# Patient Record
Sex: Male | Born: 1965 | Race: White | Hispanic: No | Marital: Married | Smoking: Never smoker
Health system: Southern US, Community
[De-identification: ages and names within clinical notes are randomized; demographics above are authoritative.]

## PROBLEM LIST (undated history)

## (undated) DIAGNOSIS — K219 Gastro-esophageal reflux disease without esophagitis: Secondary | ICD-10-CM

## (undated) DIAGNOSIS — G4730 Sleep apnea, unspecified: Secondary | ICD-10-CM

## (undated) DIAGNOSIS — K759 Inflammatory liver disease, unspecified: Secondary | ICD-10-CM

## (undated) DIAGNOSIS — I10 Essential (primary) hypertension: Secondary | ICD-10-CM

## (undated) DIAGNOSIS — I2510 Atherosclerotic heart disease of native coronary artery without angina pectoris: Secondary | ICD-10-CM

## (undated) HISTORY — PX: CARDIAC CATHETERIZATION: SHX172

## (undated) HISTORY — PX: CHOLECYSTECTOMY: SHX55

## (undated) HISTORY — PX: COLONOSCOPY WITH ESOPHAGOGASTRODUODENOSCOPY (EGD): SHX5779

---

## 2004-08-05 ENCOUNTER — Ambulatory Visit: Payer: Self-pay | Admitting: Unknown Physician Specialty

## 2004-08-05 ENCOUNTER — Other Ambulatory Visit: Payer: Self-pay

## 2004-08-05 ENCOUNTER — Emergency Department: Payer: Self-pay | Admitting: Emergency Medicine

## 2004-08-09 ENCOUNTER — Ambulatory Visit: Payer: Self-pay | Admitting: Unknown Physician Specialty

## 2004-08-12 ENCOUNTER — Ambulatory Visit: Payer: Self-pay | Admitting: Surgery

## 2012-12-12 ENCOUNTER — Ambulatory Visit: Payer: Self-pay | Admitting: Physician Assistant

## 2012-12-23 ENCOUNTER — Ambulatory Visit: Payer: Self-pay | Admitting: Internal Medicine

## 2017-06-15 ENCOUNTER — Other Ambulatory Visit: Payer: Self-pay | Admitting: Physician Assistant

## 2017-06-15 DIAGNOSIS — R319 Hematuria, unspecified: Secondary | ICD-10-CM

## 2017-06-26 ENCOUNTER — Ambulatory Visit
Admission: RE | Admit: 2017-06-26 | Discharge: 2017-06-26 | Disposition: A | Discharge status: Home / Self Care | Payer: 59 | Source: Ambulatory Visit | Attending: Physician Assistant | Admitting: Physician Assistant

## 2017-06-26 DIAGNOSIS — M5136 Other intervertebral disc degeneration, lumbar region: Secondary | ICD-10-CM | POA: Insufficient documentation

## 2017-06-26 DIAGNOSIS — K760 Fatty (change of) liver, not elsewhere classified: Secondary | ICD-10-CM | POA: Diagnosis not present

## 2017-06-26 DIAGNOSIS — M47896 Other spondylosis, lumbar region: Secondary | ICD-10-CM | POA: Diagnosis not present

## 2017-06-26 DIAGNOSIS — R319 Hematuria, unspecified: Secondary | ICD-10-CM | POA: Diagnosis not present

## 2017-06-26 DIAGNOSIS — I700 Atherosclerosis of aorta: Secondary | ICD-10-CM | POA: Insufficient documentation

## 2017-06-26 HISTORY — DX: Essential (primary) hypertension: I10

## 2017-06-26 MED ORDER — IOPAMIDOL (ISOVUE-300) INJECTION 61%
125.0000 mL | Freq: Once | INTRAVENOUS | Status: AC | PRN
  Administered 2017-06-26: 125 mL via INTRAVENOUS

## 2018-07-12 IMAGING — CT CT ABD-PEL WO/W CM
3 of 6 series · 14 of 32 positions shown, 19 images · IV contrast (APPLIED)
Comparison: None.

CLINICAL DATA: Gross hematuria in urinary frequency for 2 months.

EXAM:
CT ABDOMEN AND PELVIS WITHOUT AND WITH CONTRAST
TECHNIQUE: Multidetector CT imaging of the abdomen and pelvis was performed
following the standard protocol before and following the bolus
administration of intravenous contrast.
CONTRAST:  125mL LEKKBW-4KK IOPAMIDOL (LEKKBW-4KK) INJECTION 61%

[Series 2: axial pre · axial · non-contrast · 0.91mm/px · z∈[-991,-901]mm · 2 of 111 slices shown]
[im 19/111  soft-tissue]
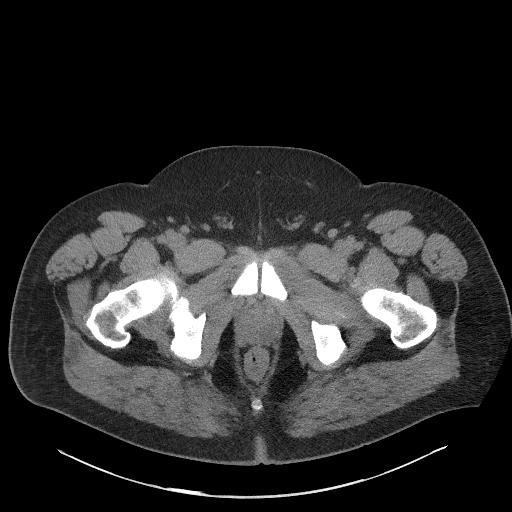
[im 37/111  soft-tissue]
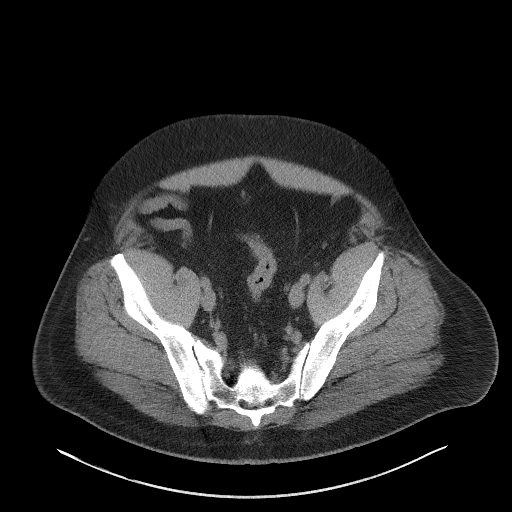

[Series 4: axial post · axial · 0.91mm/px · z∈[-1006,-611]mm · 6 of 111 slices shown]
[im 16/111  soft-tissue]
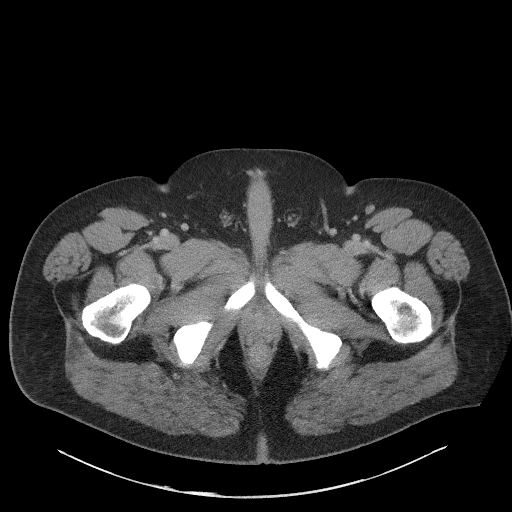
[im 32/111  soft-tissue]
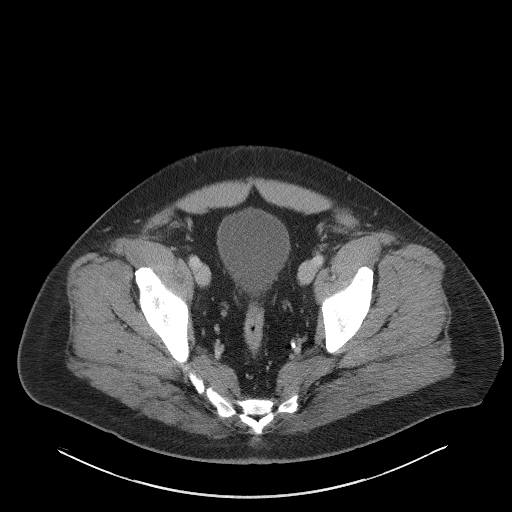
[im 48/111  soft-tissue]
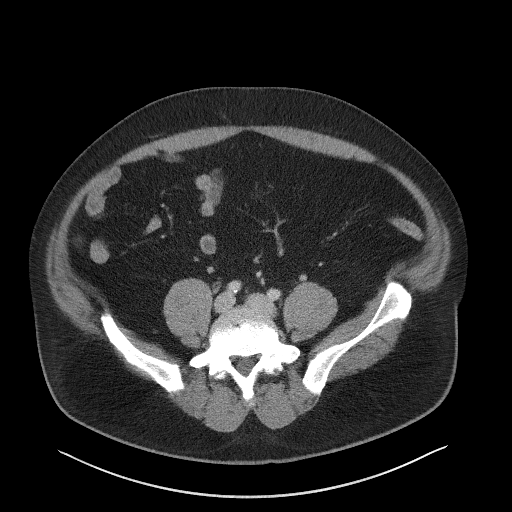
[im 63/111  soft-tissue]
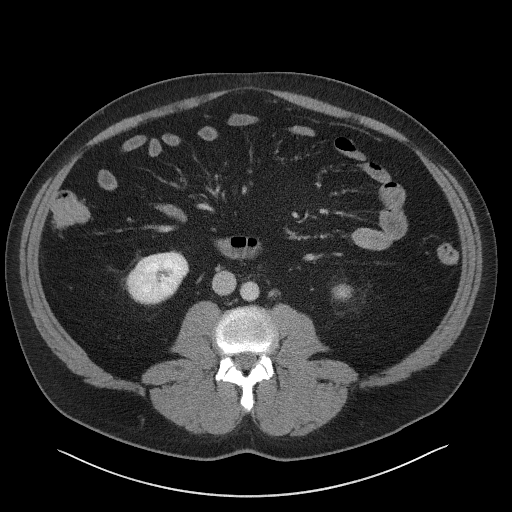
[im 79/111  soft-tissue]
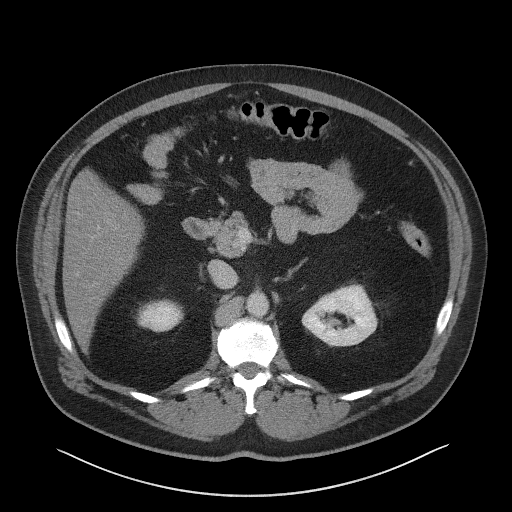
[im 95/111  soft-tissue]
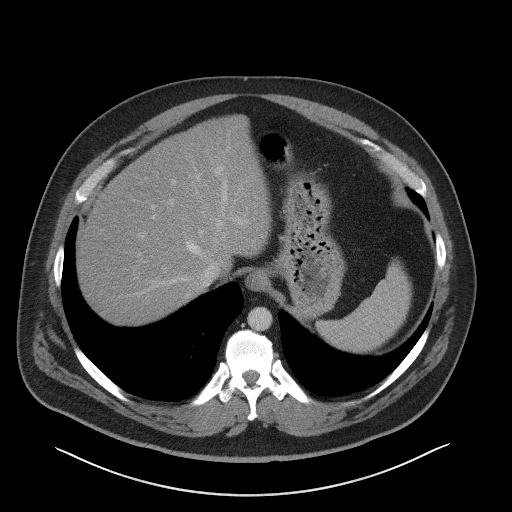

[Series 13: axial delay · axial · delayed · 0.87mm/px · z∈[-1009,-584]mm · 6 of 119 slices shown, 11 images]
[im 17/119  soft-tissue]
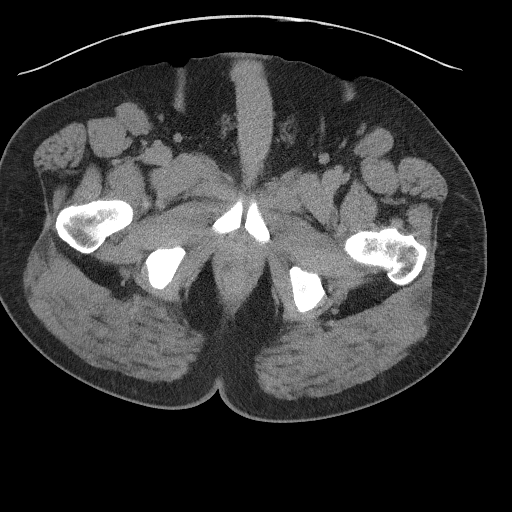
[im 17/119  bone]
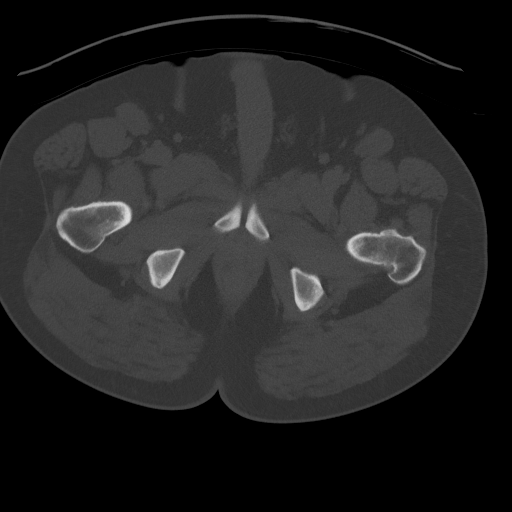
[im 34/119  soft-tissue]
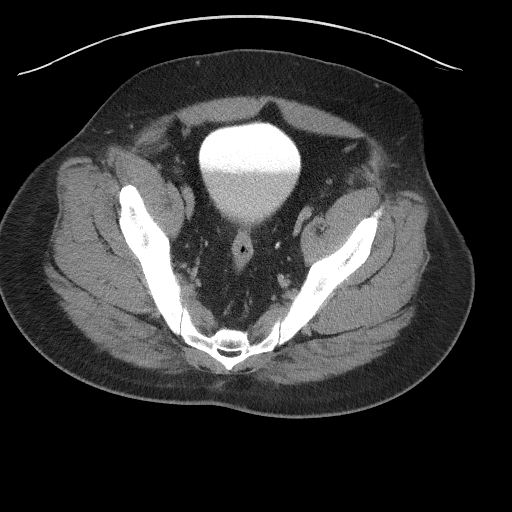
[im 51/119  soft-tissue]
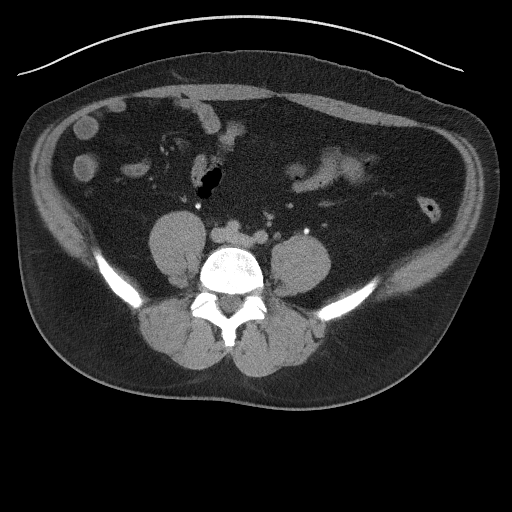
[im 51/119  lung]
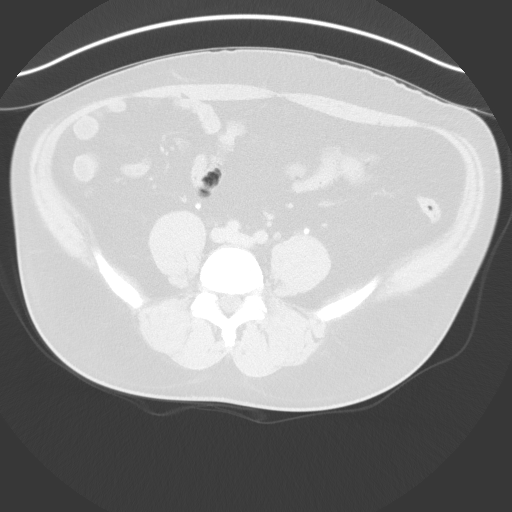
[im 68/119  soft-tissue]
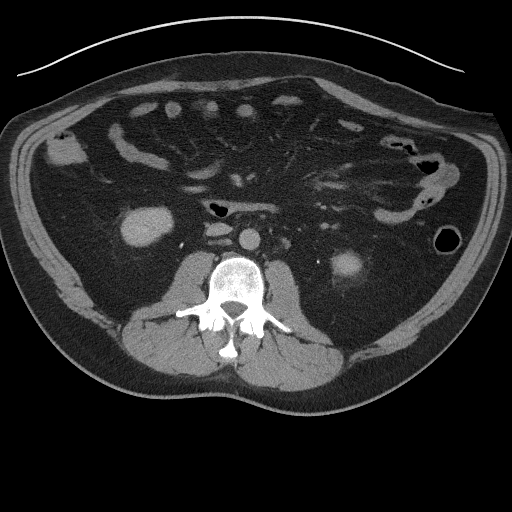
[im 68/119  lung]
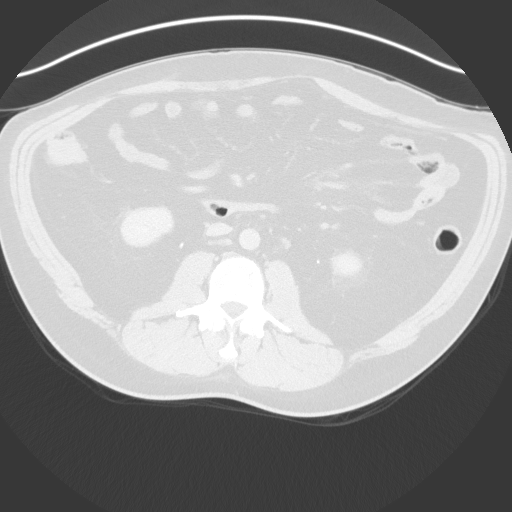
[im 85/119  soft-tissue]
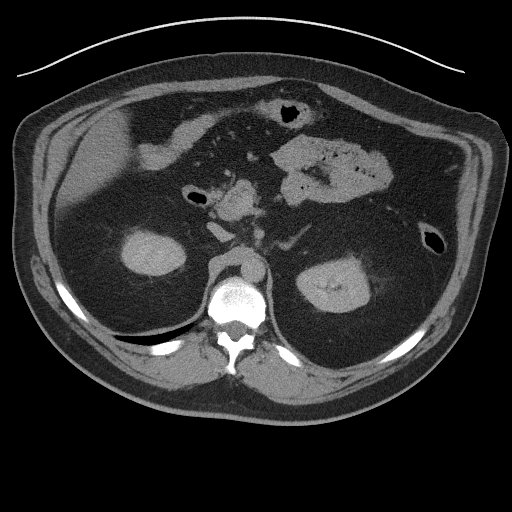
[im 85/119  lung]
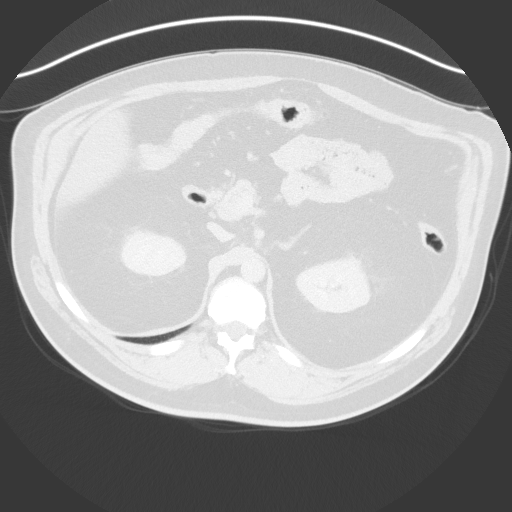
[im 102/119  soft-tissue]
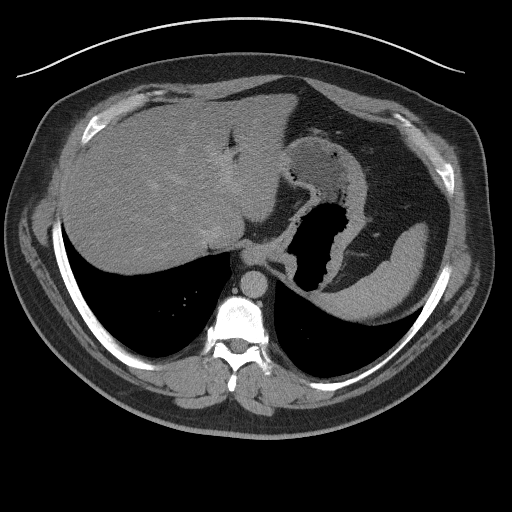
[im 102/119  lung]
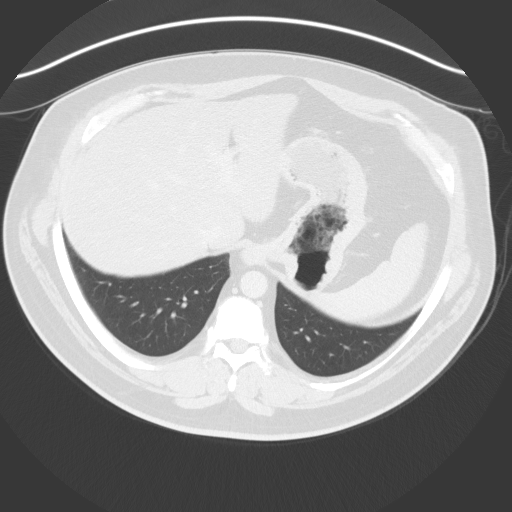

[14 of 32 positions shown; findings below may reference images not displayed]

FINDINGS: Lower chest: Unremarkable

Hepatobiliary: Diffuse hepatic steatosis.  Cholecystectomy.

Pancreas: Unremarkable

Spleen: Unremarkable

Adrenals/Urinary Tract: No urinary tract calculi, abnormal renal
parenchymal enhancement, or abnormal urographic phase filling defect
along the urothelium to explain the patient's hematuria. Adrenal
glands normal.

Stomach/Bowel: Unremarkable

Vascular/Lymphatic: Mild aortoiliac atherosclerotic vascular
disease. No pathologic adenopathy.

Reproductive: Unremarkable

Other: No supplemental non-categorized findings.

Musculoskeletal: Lumbar spondylosis and degenerative disc disease
causing suspected foraminal impingement at L4-5 and to a lesser
extent at L3-4. Small direct right inguinal hernia contains adipose
tissue.
IMPRESSION: 1. No urinary tract calculi, abnormal renal parenchymal enhancement,
or abnormal urographic phase filling defect along the urothelium to
explain the patient's hematuria.
2.  Aortic Atherosclerosis ([id number]-BE4.4).
3. Lumbar spondylosis and degenerative disc disease causing
impingement at L4-5 and L3-4.
4. Diffuse hepatic steatosis .

## 2018-12-30 ENCOUNTER — Ambulatory Visit
Admission: RE | Admit: 2018-12-30 | Payer: Managed Care, Other (non HMO) | Source: Ambulatory Visit | Admitting: Unknown Physician Specialty

## 2018-12-30 ENCOUNTER — Encounter: Admission: RE | Payer: Self-pay | Source: Ambulatory Visit

## 2018-12-30 SURGERY — ESOPHAGOGASTRODUODENOSCOPY (EGD) WITH PROPOFOL
Anesthesia: General

## 2020-01-04 ENCOUNTER — Other Ambulatory Visit: Payer: Self-pay

## 2020-01-04 ENCOUNTER — Ambulatory Visit: Payer: Managed Care, Other (non HMO) | Attending: Internal Medicine

## 2020-01-04 DIAGNOSIS — Z23 Encounter for immunization: Secondary | ICD-10-CM

## 2020-01-04 NOTE — Progress Notes (Signed)
   [id number]  Name:  Taylor BRASUELL Sr.    MRN: [id number] DOB: 1966-02-05  01/04/2020  Mr. Taylor Roberson was observed post Covid-19 immunization for 15 minutes without incident. He was provided with Vaccine Information Sheet and instruction to access the V-Safe system.   Mr. Taylor Roberson was instructed to call 911 with any severe reactions post vaccine: Marland Kitchen Difficulty breathing  . Swelling of face and throat  . A fast heartbeat  . A bad rash all over body  . Dizziness and weakness   Immunizations Administered    Name Date Dose VIS Date Route   Pfizer COVID-19 Vaccine 01/04/2020 10:59 AM 0.3 mL 09/19/2019 Intramuscular   Manufacturer: [employer]   Lot: [id number]   [place]: [id number]

## 2020-01-28 ENCOUNTER — Ambulatory Visit: Payer: Managed Care, Other (non HMO) | Attending: Internal Medicine

## 2020-01-28 DIAGNOSIS — Z23 Encounter for immunization: Secondary | ICD-10-CM

## 2020-01-28 NOTE — Progress Notes (Signed)
   [id number]  Name:  Taylor DOLLOFF Sr.    MRN: [id number] DOB: [birth year]  01/28/2020  [name] was observed post Covid-19 immunization for 15 minutes without incident. He was provided with Vaccine Information Sheet and instruction to access the V-Safe system.   [name] was instructed to call 911 with any severe reactions post vaccine: Marland Kitchen Difficulty breathing  . Swelling of face and throat  . A fast heartbeat  . A bad rash all over body  . Dizziness and weakness   Immunizations Administered    Name Date Dose VIS Date Route   Pfizer COVID-19 Vaccine 01/28/2020 10:23 AM 0.3 mL 12/03/2018 Intramuscular   Manufacturer: [place]   Lot: [id number]   [place]: [id number]

## 2020-04-19 ENCOUNTER — Other Ambulatory Visit
Admission: RE | Admit: 2020-04-19 | Discharge: 2020-04-19 | Disposition: A | Discharge status: Home / Self Care | Payer: Managed Care, Other (non HMO) | Source: Ambulatory Visit | Attending: General Surgery | Admitting: General Surgery

## 2020-04-19 ENCOUNTER — Other Ambulatory Visit: Payer: Self-pay

## 2020-04-19 DIAGNOSIS — Z20822 Contact with and (suspected) exposure to covid-19: Secondary | ICD-10-CM | POA: Diagnosis present

## 2020-04-19 LAB — SARS CORONAVIRUS 2 (TAT 6-24 HRS): SARS Coronavirus 2: NEGATIVE

## 2020-04-20 ENCOUNTER — Encounter: Payer: Self-pay | Admitting: General Surgery

## 2020-04-21 ENCOUNTER — Other Ambulatory Visit: Payer: Self-pay

## 2020-04-21 ENCOUNTER — Ambulatory Visit: Payer: Managed Care, Other (non HMO) | Admitting: Certified Registered Nurse Anesthetist

## 2020-04-21 ENCOUNTER — Encounter
Admission: RE | Disposition: A | Discharge status: Home / Self Care | Payer: Self-pay | Source: Home / Self Care | Attending: General Surgery

## 2020-04-21 ENCOUNTER — Ambulatory Visit
Admission: RE | Admit: 2020-04-21 | Discharge: 2020-04-21 | Disposition: A | Discharge status: Home / Self Care | Payer: Managed Care, Other (non HMO) | Attending: General Surgery | Admitting: General Surgery

## 2020-04-21 ENCOUNTER — Encounter: Payer: Self-pay | Admitting: General Surgery

## 2020-04-21 DIAGNOSIS — I2510 Atherosclerotic heart disease of native coronary artery without angina pectoris: Secondary | ICD-10-CM | POA: Insufficient documentation

## 2020-04-21 DIAGNOSIS — D123 Benign neoplasm of transverse colon: Secondary | ICD-10-CM | POA: Diagnosis not present

## 2020-04-21 DIAGNOSIS — K219 Gastro-esophageal reflux disease without esophagitis: Secondary | ICD-10-CM | POA: Insufficient documentation

## 2020-04-21 DIAGNOSIS — G4730 Sleep apnea, unspecified: Secondary | ICD-10-CM | POA: Diagnosis not present

## 2020-04-21 DIAGNOSIS — Z79899 Other long term (current) drug therapy: Secondary | ICD-10-CM | POA: Diagnosis not present

## 2020-04-21 DIAGNOSIS — Z8371 Family history of colonic polyps: Secondary | ICD-10-CM | POA: Diagnosis not present

## 2020-04-21 DIAGNOSIS — I10 Essential (primary) hypertension: Secondary | ICD-10-CM | POA: Insufficient documentation

## 2020-04-21 DIAGNOSIS — Z1211 Encounter for screening for malignant neoplasm of colon: Secondary | ICD-10-CM | POA: Insufficient documentation

## 2020-04-21 HISTORY — DX: Gastro-esophageal reflux disease without esophagitis: K21.9

## 2020-04-21 HISTORY — PX: COLONOSCOPY WITH PROPOFOL: SHX5780

## 2020-04-21 HISTORY — DX: Sleep apnea, unspecified: G47.30

## 2020-04-21 HISTORY — DX: Atherosclerotic heart disease of native coronary artery without angina pectoris: I25.10

## 2020-04-21 HISTORY — DX: Inflammatory liver disease, unspecified: K75.9

## 2020-04-21 SURGERY — COLONOSCOPY WITH PROPOFOL
Anesthesia: General

## 2020-04-21 MED ORDER — SODIUM CHLORIDE 0.9 % IV SOLN: INTRAVENOUS | Status: DC

## 2020-04-21 MED ORDER — PROPOFOL 10 MG/ML IV BOLUS
INTRAVENOUS | Status: DC | PRN
  Administered 2020-04-21: 80 mg via INTRAVENOUS

## 2020-04-21 MED ORDER — PROPOFOL 500 MG/50ML IV EMUL
INTRAVENOUS | Status: DC | PRN
  Administered 2020-04-21: 160 ug/kg/min via INTRAVENOUS

## 2020-04-21 NOTE — Transfer of Care (Signed)
Immediate Anesthesia Transfer of Care Note  Patient: Taylor BLUCHER Sr.  Procedure(s) Performed: COLONOSCOPY WITH PROPOFOL (N/A )  Patient Location: PACU  Anesthesia Type:General  Level of Consciousness: awake and alert   Airway & Oxygen Therapy: Patient Spontanous Breathing and Patient connected to nasal cannula oxygen  Post-op Assessment: Report given to RN and Post -op Vital signs reviewed and stable  Post vital signs: Reviewed and stable  Last Vitals:  Vitals Value Taken Time  BP 109/61 04/21/20 0943  Temp 36.1 C 04/21/20 0943  Pulse 88 04/21/20 0945  Resp 13 04/21/20 0945  SpO2 98 % 04/21/20 0945  Vitals shown include unvalidated device data.  Last Pain:  Vitals:   04/21/20 0943  TempSrc: Temporal  PainSc:          Complications: No complications documented.

## 2020-04-21 NOTE — Anesthesia Postprocedure Evaluation (Signed)
Anesthesia Post Note  Patient: Taylor NIEMCZYK Sr.  Procedure(s) Performed: COLONOSCOPY WITH PROPOFOL (N/A )  Patient location during evaluation: Endoscopy Anesthesia Type: General Level of consciousness: awake and alert Pain management: pain level controlled Vital Signs Assessment: post-procedure vital signs reviewed and stable Respiratory status: spontaneous breathing and respiratory function stable Cardiovascular status: stable Anesthetic complications: no   No complications documented.   Last Vitals:  Vitals:   04/21/20 0943 04/21/20 0953  BP: 109/61 130/81  Pulse:    Resp:    Temp: (!) 36.1 C   SpO2:      Last Pain:  Vitals:   04/21/20 0943  TempSrc: Temporal  PainSc:                  Dennard Nip K

## 2020-04-21 NOTE — H&P (Signed)
Taylor Roberson [id number] Feb 16, 1966     HPI:  54 year old male with family history of colon polyps (parents) and colon cancer (Grandfather). For screening colonoscopy.   Medications Prior to Admission  Medication Sig Dispense Refill Last Dose  . famotidine (PEPCID) 40 MG tablet Take 40 mg by mouth daily.   04/20/2020 at Unknown time  . fluticasone (FLONASE) 50 MCG/ACT nasal spray Place into both nostrils daily.   04/20/2020 at Unknown time  . losartan (COZAAR) 50 MG tablet Take 50 mg by mouth daily.   04/21/2020 at Unknown time  . nystatin (MYCOSTATIN/NYSTOP) powder Apply 1 application topically 2 (two) times daily.      . pantoprazole (PROTONIX) 40 MG tablet Take 40 mg by mouth daily.   04/20/2020 at Unknown time  . simvastatin (ZOCOR) 40 MG tablet Take 40 mg by mouth daily.   Past Week at Unknown time   No Known Allergies Past Medical History:  Diagnosis Date  . Coronary artery disease   . GERD (gastroesophageal reflux disease)   . Hepatitis   . Hypertension   . Sleep apnea    Past Surgical History:  Procedure Laterality Date  . CARDIAC CATHETERIZATION    . CHOLECYSTECTOMY    . COLONOSCOPY WITH ESOPHAGOGASTRODUODENOSCOPY (EGD) N/A    Social History   Socioeconomic History  . Marital status: Married    Spouse name: Not on file  . Number of children: Not on file  . Years of education: Not on file  . Highest education level: Not on file  Occupational History  . Not on file  Tobacco Use  . Smoking status: Never Smoker  . Smokeless tobacco: Current User    Types: Chew  Vaping Use  . Vaping Use: Never used  Substance and Sexual Activity  . Alcohol use: Yes    Comment: every weekend   . Drug use: Never  . Sexual activity: Not on file  Other Topics Concern  . Not on file  Social History Narrative  . Not on file   Social Determinants of Health   Financial Resource Strain:   . Difficulty of Paying Living Expenses:   Food Insecurity:   . Worried About Ship broker in the Last Year:   . Arboriculturist in the Last Year:   Transportation Needs:   . Film/video editor (Medical):   Marland Kitchen Lack of Transportation (Non-Medical):   Physical Activity:   . Days of Exercise per Week:   . Minutes of Exercise per Session:   Stress:   . Feeling of Stress :   Social Connections:   . Frequency of Communication with Friends and Family:   . Frequency of Social Gatherings with Friends and Family:   . Attends Religious Services:   . Active Member of Clubs or Organizations:   . Attends Archivist Meetings:   Marland Kitchen Marital Status:   Intimate Partner Violence:   . Fear of Current or Ex-Partner:   . Emotionally Abused:   Marland Kitchen Physically Abused:   . Sexually Abused:    Social History   Social History Narrative  . Not on file     ROS: Negative.     PE: HEENT: Negative. Lungs: Clear. Cardio: RR.  Assessment/Plan:  Proceed with planned endoscopy.    [facility] 04/21/2020

## 2020-04-21 NOTE — Op Note (Signed)
[facility] Patient Name: Taylor Roberson Procedure Date: 04/21/2020 9:06 AM MRN: [id number] Account #: [id number] Date of Birth: 09/07/1966 Admit Type: Outpatient Age: 54 Room: [facility] ENDO ROOM 1 Gender: Male Note Status: Finalized Procedure:             Colonoscopy Indications:           Screening for colorectal malignant neoplasm Providers:             Robert Bellow, MD Medicines:             Monitored Anesthesia Care Complications:         No immediate complications. Procedure:             Pre-Anesthesia Assessment:                        - Prior to the procedure, a History and Physical was                         performed, and patient medications, allergies and                         sensitivities were reviewed. The patient's tolerance                         of previous anesthesia was reviewed.                        - The risks and benefits of the procedure and the                         sedation options and risks were discussed with the                         patient. All questions were answered and informed                         consent was obtained.                        After obtaining informed consent, the colonoscope was                         passed under direct vision. Throughout the procedure,                         the patient's blood pressure, pulse, and oxygen                         saturations were monitored continuously. The                         Colonoscope was introduced through the anus and                         advanced to the the cecum, identified by appendiceal                         orifice and ileocecal valve. The colonoscopy was  performed without difficulty. The patient tolerated                         the procedure well. The quality of the bowel                         preparation was excellent. Findings:      A 5 mm polyp was found in the transverse colon. The polyp was  sessile.       This was biopsied with a cold forceps for histology.      The retroflexed view of the distal rectum and anal verge was normal and       showed no anal or rectal abnormalities. Impression:            - One 5 mm polyp in the transverse colon. Biopsied.                        - The distal rectum and anal verge are normal on                         retroflexion view. Recommendation:        - Repeat colonoscopy in 5 years for surveillance.                        - Telephone endoscopist for pathology results in 1                         week. Procedure Code(s):     --- Professional ---                        [phone], Colonoscopy, flexible; with biopsy, single or                         multiple Diagnosis Code(s):     --- Professional ---                        Z12.11, Encounter for screening for malignant neoplasm                         of colon                        K63.5, Polyp of colon CPT copyright 2019 American [employer]. All rights reserved. The codes documented in this report are preliminary and upon coder review may  be revised to meet current compliance requirements. Robert Bellow, MD 04/21/2020 9:41:09 AM This report has been signed electronically. Number of Addenda: 0 Note Initiated On: 04/21/2020 9:06 AM Scope Withdrawal Time: 0 hours 12 minutes 43 seconds  Total Procedure Duration: 0 hours 17 minutes 35 seconds       [facility]

## 2020-04-21 NOTE — Anesthesia Preprocedure Evaluation (Signed)
Anesthesia Evaluation  Patient identified by MRN, date of birth, ID band Patient awake    Reviewed: Allergy & Precautions, NPO status , Patient's Chart, lab work & pertinent test results  History of Anesthesia Complications Negative for: history of anesthetic complications  Airway Mallampati: II       Dental   Pulmonary sleep apnea (just retested, resolved) , neg COPD, Not current smoker,           Cardiovascular hypertension, Pt. on medications (-) Past MI and (-) CHF (-) dysrhythmias (-) Valvular Problems/Murmurs     Neuro/Psych neg Seizures    GI/Hepatic GERD  Medicated and Controlled,(+) Hepatitis - (Pt denies)  Endo/Other  neg diabetes  Renal/GU negative Renal ROS     Musculoskeletal   Abdominal   Peds  Hematology   Anesthesia Other Findings   Reproductive/Obstetrics                             Anesthesia Physical Anesthesia Plan  ASA: II  Anesthesia Plan: General   Post-op Pain Management:    Induction: Intravenous  PONV Risk Score and Plan: 2 and Propofol infusion and TIVA  Airway Management Planned: Nasal Cannula  Additional Equipment:   Intra-op Plan:   Post-operative Plan:   Informed Consent: I have reviewed the patients History and Physical, chart, labs and discussed the procedure including the risks, benefits and alternatives for the proposed anesthesia with the patient or authorized representative who has indicated his/her understanding and acceptance.       Plan Discussed with:   Anesthesia Plan Comments:         Anesthesia Quick Evaluation

## 2020-04-22 ENCOUNTER — Encounter: Payer: Self-pay | Admitting: General Surgery

## 2020-04-22 LAB — SURGICAL PATHOLOGY

## 2021-12-27 ENCOUNTER — Other Ambulatory Visit
Admission: RE | Admit: 2021-12-27 | Discharge: 2021-12-27 | Disposition: A | Discharge status: Home / Self Care | Payer: Managed Care, Other (non HMO) | Source: Ambulatory Visit | Attending: Internal Medicine | Admitting: Internal Medicine

## 2021-12-27 DIAGNOSIS — Z01818 Encounter for other preprocedural examination: Secondary | ICD-10-CM | POA: Insufficient documentation

## 2021-12-27 DIAGNOSIS — Z79899 Other long term (current) drug therapy: Secondary | ICD-10-CM | POA: Diagnosis present

## 2021-12-27 DIAGNOSIS — R0602 Shortness of breath: Secondary | ICD-10-CM | POA: Insufficient documentation

## 2021-12-27 DIAGNOSIS — I509 Heart failure, unspecified: Secondary | ICD-10-CM | POA: Diagnosis not present

## 2021-12-27 LAB — BRAIN NATRIURETIC PEPTIDE: B Natriuretic Peptide: 6.1 pg/mL (ref 0.0–100.0)

## 2021-12-30 ENCOUNTER — Ambulatory Visit
Admission: RE | Admit: 2021-12-30 | Discharge: 2021-12-30 | Disposition: A | Discharge status: Home / Self Care | Payer: Managed Care, Other (non HMO) | Attending: Internal Medicine | Admitting: Internal Medicine

## 2021-12-30 ENCOUNTER — Encounter: Payer: Self-pay | Admitting: Internal Medicine

## 2021-12-30 ENCOUNTER — Encounter
Admission: RE | Disposition: A | Discharge status: Home / Self Care | Payer: Self-pay | Source: Home / Self Care | Attending: Internal Medicine

## 2021-12-30 DIAGNOSIS — R9439 Abnormal result of other cardiovascular function study: Secondary | ICD-10-CM | POA: Diagnosis present

## 2021-12-30 DIAGNOSIS — I2510 Atherosclerotic heart disease of native coronary artery without angina pectoris: Secondary | ICD-10-CM | POA: Insufficient documentation

## 2021-12-30 HISTORY — PX: LEFT HEART CATH AND CORONARY ANGIOGRAPHY: CATH118249

## 2021-12-30 SURGERY — LEFT HEART CATH AND CORONARY ANGIOGRAPHY
Anesthesia: Moderate Sedation | Laterality: Left

## 2021-12-30 MED ORDER — SODIUM CHLORIDE 0.9 % IV SOLN: 250.0000 mL | INTRAVENOUS | Status: DC | PRN

## 2021-12-30 MED ORDER — SODIUM CHLORIDE 0.9% FLUSH: 3.0000 mL | INTRAVENOUS | Status: DC | PRN

## 2021-12-30 MED ORDER — HYDRALAZINE HCL 20 MG/ML IJ SOLN: 10.0000 mg | INTRAMUSCULAR | Status: DC | PRN

## 2021-12-30 MED ORDER — FENTANYL CITRATE (PF) 100 MCG/2ML IJ SOLN
INTRAMUSCULAR | Status: DC | PRN
  Administered 2021-12-30: 50 ug via INTRAVENOUS

## 2021-12-30 MED ORDER — FENTANYL CITRATE (PF) 100 MCG/2ML IJ SOLN
INTRAMUSCULAR | Status: AC
  Filled 2021-12-30: qty 2

## 2021-12-30 MED ORDER — ASPIRIN 81 MG PO CHEW
CHEWABLE_TABLET | ORAL | Status: AC
  Filled 2021-12-30: qty 1

## 2021-12-30 MED ORDER — VERAPAMIL HCL 2.5 MG/ML IV SOLN
INTRAVENOUS | Status: DC | PRN
  Administered 2021-12-30: 2.5 mg via INTRA_ARTERIAL

## 2021-12-30 MED ORDER — MIDAZOLAM HCL 2 MG/2ML IJ SOLN
INTRAMUSCULAR | Status: AC
  Filled 2021-12-30: qty 2

## 2021-12-30 MED ORDER — ASPIRIN 81 MG PO CHEW
81.0000 mg | CHEWABLE_TABLET | ORAL | Status: AC
  Administered 2021-12-30: 81 mg via ORAL

## 2021-12-30 MED ORDER — MIDAZOLAM HCL 2 MG/2ML IJ SOLN
INTRAMUSCULAR | Status: DC | PRN
  Administered 2021-12-30: 1 mg via INTRAVENOUS

## 2021-12-30 MED ORDER — SODIUM CHLORIDE 0.9 % WEIGHT BASED INFUSION: 3.0000 mL/kg/h | INTRAVENOUS | Status: AC

## 2021-12-30 MED ORDER — SODIUM CHLORIDE 0.9% FLUSH: 3.0000 mL | Freq: Two times a day (BID) | INTRAVENOUS | Status: DC

## 2021-12-30 MED ORDER — HEPARIN SODIUM (PORCINE) 1000 UNIT/ML IJ SOLN
INTRAMUSCULAR | Status: AC
  Filled 2021-12-30: qty 10

## 2021-12-30 MED ORDER — LABETALOL HCL 5 MG/ML IV SOLN: 10.0000 mg | INTRAVENOUS | Status: DC | PRN

## 2021-12-30 MED ORDER — HEPARIN SODIUM (PORCINE) 1000 UNIT/ML IJ SOLN
INTRAMUSCULAR | Status: DC | PRN
  Administered 2021-12-30: 6000 [IU] via INTRAVENOUS

## 2021-12-30 MED ORDER — SODIUM CHLORIDE 0.9 % WEIGHT BASED INFUSION: 1.0000 mL/kg/h | INTRAVENOUS | Status: DC

## 2021-12-30 MED ORDER — LIDOCAINE HCL (PF) 1 % IJ SOLN
INTRAMUSCULAR | Status: DC | PRN
  Administered 2021-12-30: 2 mL

## 2021-12-30 MED ORDER — ONDANSETRON HCL 4 MG/2ML IJ SOLN: 4.0000 mg | Freq: Four times a day (QID) | INTRAMUSCULAR | Status: DC | PRN

## 2021-12-30 MED ORDER — VERAPAMIL HCL 2.5 MG/ML IV SOLN
INTRAVENOUS | Status: AC
  Filled 2021-12-30: qty 2

## 2021-12-30 MED ORDER — LIDOCAINE HCL 1 % IJ SOLN
INTRAMUSCULAR | Status: AC
  Filled 2021-12-30: qty 20

## 2021-12-30 MED ORDER — HEPARIN (PORCINE) IN NACL 2000-0.9 UNIT/L-% IV SOLN
INTRAVENOUS | Status: DC | PRN
  Administered 2021-12-30: 1000 mL

## 2021-12-30 MED ORDER — ACETAMINOPHEN 325 MG PO TABS: 650.0000 mg | ORAL_TABLET | ORAL | Status: DC | PRN

## 2021-12-30 MED ORDER — IOHEXOL 300 MG/ML  SOLN
INTRAMUSCULAR | Status: DC | PRN
  Administered 2021-12-30: 80 mL

## 2021-12-30 SURGICAL SUPPLY — 11 items
BAND ZEPHYR COMPRESS 30 LONG (HEMOSTASIS) ×1 IMPLANT
CATH 5FR JR4 DIAGNOSTIC (CATHETERS) ×1 IMPLANT
CATH JL3.5 FR DIAG (CATHETERS) ×1 IMPLANT
DRAPE BRACHIAL (DRAPES) ×1 IMPLANT
GLIDESHEATH SLEND SS 6F .021 (SHEATH) ×1 IMPLANT
GUIDEWIRE INQWIRE 1.5J.035X260 (WIRE) IMPLANT
INQWIRE 1.5J .035X260CM (WIRE) ×2
PACK CARDIAC CATH (CUSTOM PROCEDURE TRAY) ×2 IMPLANT
PROTECTION STATION PRESSURIZED (MISCELLANEOUS) ×2
SET ATX SIMPLICITY (MISCELLANEOUS) ×1 IMPLANT
STATION PROTECTION PRESSURIZED (MISCELLANEOUS) IMPLANT

## 2021-12-30 NOTE — Discharge Instructions (Signed)
Follow-up with

## 2022-01-01 LAB — CARDIAC CATHETERIZATION: Cath EF Quantitative: 55 %

## 2022-01-02 ENCOUNTER — Encounter: Payer: Self-pay | Admitting: Internal Medicine
